# Patient Record
Sex: Male | Born: 2000 | Race: Black or African American | Hispanic: No | Marital: Single | Smoking: Never smoker
Health system: Southern US, Community
[De-identification: ages and names within clinical notes are randomized; demographics above are authoritative.]

## PROBLEM LIST (undated history)

## (undated) DIAGNOSIS — J45909 Unspecified asthma, uncomplicated: Secondary | ICD-10-CM

## (undated) DIAGNOSIS — M419 Scoliosis, unspecified: Secondary | ICD-10-CM

---
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
## 2020-08-07 ENCOUNTER — Encounter (HOSPITAL_BASED_OUTPATIENT_CLINIC_OR_DEPARTMENT_OTHER): Payer: Self-pay | Admitting: *Deleted

## 2020-08-07 ENCOUNTER — Other Ambulatory Visit: Payer: Self-pay

## 2020-08-07 DIAGNOSIS — Z7952 Long term (current) use of systemic steroids: Secondary | ICD-10-CM | POA: Insufficient documentation

## 2020-08-07 DIAGNOSIS — W208XXA Other cause of strike by thrown, projected or falling object, initial encounter: Secondary | ICD-10-CM | POA: Diagnosis not present

## 2020-08-07 DIAGNOSIS — J45909 Unspecified asthma, uncomplicated: Secondary | ICD-10-CM | POA: Diagnosis not present

## 2020-08-07 DIAGNOSIS — S0990XA Unspecified injury of head, initial encounter: Secondary | ICD-10-CM | POA: Diagnosis present

## 2020-08-07 NOTE — ED Triage Notes (Signed)
C/o head injury x 3 days ago , denies LOC , c/o cont h/a

## 2020-08-08 ENCOUNTER — Emergency Department (HOSPITAL_BASED_OUTPATIENT_CLINIC_OR_DEPARTMENT_OTHER)
Admission: EM | Admit: 2020-08-08 | Discharge: 2020-08-08 | Disposition: A | Discharge status: Home / Self Care | Payer: Medicaid Other | Attending: Emergency Medicine | Admitting: Emergency Medicine

## 2020-08-08 ENCOUNTER — Emergency Department (HOSPITAL_BASED_OUTPATIENT_CLINIC_OR_DEPARTMENT_OTHER): Payer: Medicaid Other

## 2020-08-08 DIAGNOSIS — S0990XA Unspecified injury of head, initial encounter: Secondary | ICD-10-CM

## 2020-08-08 MED ORDER — NAPROXEN 250 MG PO TABS
500.0000 mg | ORAL_TABLET | Freq: Once | ORAL | Status: AC
  Administered 2020-08-08: 500 mg via ORAL
  Filled 2020-08-08: qty 2

## 2020-08-08 MED ORDER — NAPROXEN 500 MG PO TABS: ORAL_TABLET | ORAL | 0 refills | Status: AC

## 2020-08-08 NOTE — ED Provider Notes (Signed)
[facility] Provider Note: Lowella Dell, MD, FACEP  CSN: [id number] MRN: [id number] ARRIVAL: 08/07/20 at 2301 ROOM: [facility]   CHIEF COMPLAINT  Head Injury   HISTORY OF PRESENT ILLNESS  08/08/20 1:48 AM Bobby Mack is a 20 y.o. male who had a crockpot fall on the top of his head while he was trying to remove it from a kitchen cabinet 3 days ago.  He did not lose consciousness.  He has not had any vomiting.  He has had persistent headaches since this occurred.  The headaches have been in various locations in his head.  He currently rates his headache as a 4 out of 10.  He has had no focal neurologic deficits or visual changes.  He has not taken anything for his headache.  He denies neck pain.   Past Medical History:  Diagnosis Date  . Asthma   . Scoliosis     History reviewed. No pertinent surgical history.  Family History  Problem Relation Age of Onset  . Eczema Other   . Asthma Neg Hx   . Allergic rhinitis Neg Hx   . Urticaria Neg Hx   . Immunodeficiency Neg Hx   . Angioedema Neg Hx     Social History   Tobacco Use  . Smoking status: Never Smoker  . Smokeless tobacco: Never Used  Vaping Use  . Vaping Use: Never used  Substance Use Topics  . Alcohol use: No  . Drug use: No    Prior to Admission medications   Medication Sig Start Date End Date Taking? Authorizing Provider  EPINEPHrine (EPIPEN 2-PAK) 0.3 mg/0.3 mL IJ SOAJ injection Use as directed for severe allergic reactions 10/14/17   Bobbitt, Heywood Iles, MD  fluticasone [facility]) 50 MCG/ACT nasal spray Use 1 spray per nostril 1-2 times daily as needed 10/14/17   Bobbitt, Heywood Iles, MD  levocetirizine (XYZAL) 5 MG tablet Take 1 tablet (5 mg total) by mouth every evening. 10/14/17   Bobbitt, Heywood Iles, MD    Allergies Shellfish allergy   REVIEW OF SYSTEMS  Negative except as noted here or in the History of Present Illness.   PHYSICAL EXAMINATION  Initial Vital Signs Blood  pressure 118/78, pulse 79, temperature 98.1 F (36.7 C), temperature source Oral, resp. rate 16, height 5\' 6"  (1.676 m), weight 81.6 kg, SpO2 100 %.  Examination General: Well-developed, well-nourished male in no acute distress; appearance consistent with age of record HENT: normocephalic; no scalp hematoma; no hemotympanum Eyes: pupils equal, round and reactive to light; extraocular muscles intact Neck: supple; nontender Heart: regular rate and rhythm Lungs: clear to auscultation bilaterally Abdomen: soft; nondistended; nontender; bowel sounds present Extremities: No deformity; full range of motion Neurologic: Awake, alert and oriented; motor function intact in all extremities and symmetric; no facial droop Skin: Warm and dry Psychiatric: Normal mood and affect   RESULTS  Summary of this visit's results, reviewed and interpreted by myself:   EKG Interpretation  Date/Time:    Ventricular Rate:    PR Interval:    QRS Duration:   QT Interval:    QTC Calculation:   R Axis:     Text Interpretation:        Laboratory Studies: No results found for this or any previous visit (from the past 24 hour(s)). Imaging Studies: CT Head Wo Contrast  Result Date: 08/08/2020 CLINICAL DATA:  Head injury 3 days ago. Continued headache. No loss of consciousness. EXAM: CT HEAD WITHOUT CONTRAST TECHNIQUE: Contiguous axial images  were obtained from the base of the skull through the vertex without intravenous contrast. COMPARISON:  None. FINDINGS: Brain: No evidence of acute infarction, hemorrhage, hydrocephalus, extra-axial collection or mass lesion/mass effect. Vascular: No hyperdense vessel or unexpected calcification. Skull: Normal. Negative for fracture or focal lesion. Sinuses/Orbits: Mucosal thickening in the paranasal sinuses. No acute air-fluid levels. Mastoid air cells are clear. Other: None. IMPRESSION: 1. No acute intracranial abnormalities. 2. Chronic inflammatory changes in the paranasal  sinuses. Electronically Signed   By: Burman Nieves M.D.   On: 08/08/2020 02:21    ED COURSE and MDM  Nursing notes, initial and subsequent vitals signs, including pulse oximetry, reviewed and interpreted by myself.  Vitals:   08/07/20 2310 08/07/20 2312  BP:  118/78  Pulse:  79  Resp:  16  Temp:  98.1 F (36.7 C)  TempSrc:  Oral  SpO2:  100%  Weight: 81.6 kg   Height: 5\' 6"  (1.676 m)    Medications  naproxen (NAPROSYN) tablet 500 mg (has no administration in time range)    No evidence of significant head injury on examination or CT.  PROCEDURES  Procedures   ED DIAGNOSES     ICD-10-CM   1. Minor head injury, initial encounter  S09.90XA        , MD 08/08/20 [phone]

## 2021-12-30 IMAGING — CT CT HEAD W/O CM
3 of 6 series · 16 of 47 positions shown, 19 images · non-contrast
Comparison: None.

CLINICAL DATA: Head injury 3 days ago. Continued headache. No loss
of consciousness.

EXAM:
CT HEAD WITHOUT CONTRAST
TECHNIQUE: Contiguous axial images were obtained from the base of the skull
through the vertex without intravenous contrast.

[Series 2: head wo · axial · 0.42mm/px · z∈[+868,+988]mm · 11 of 29 slices shown, 14 images]
[im 3/29  brain]
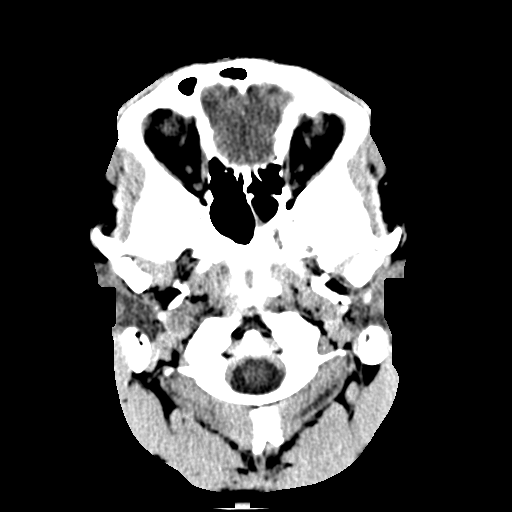
[im 3/29  bone]
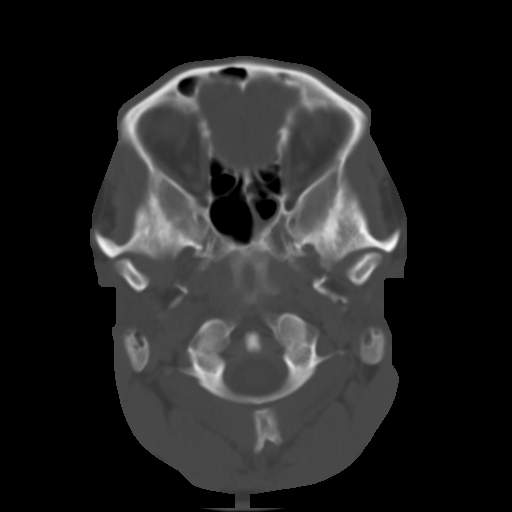
[im 5/29  brain]
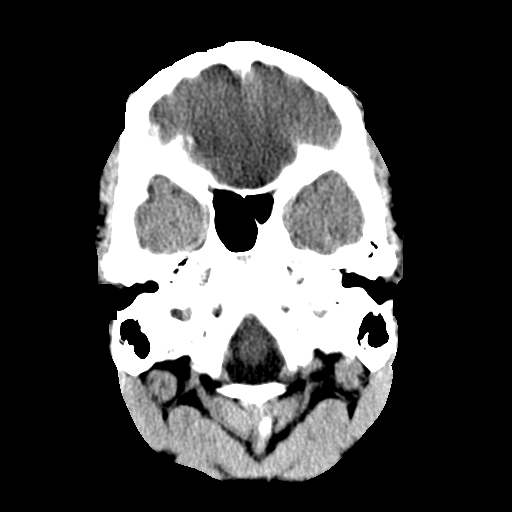
[im 7/29  brain]
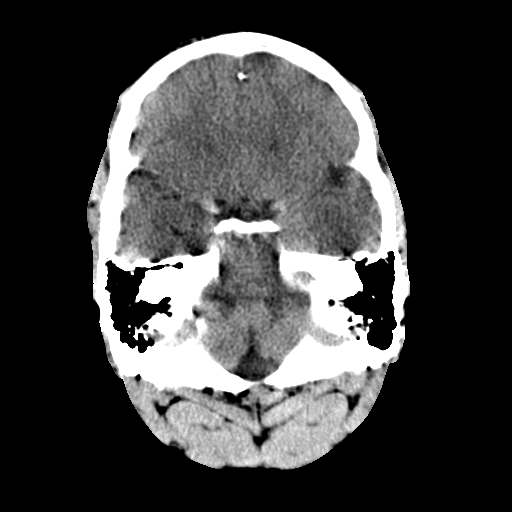
[im 11/29  brain]
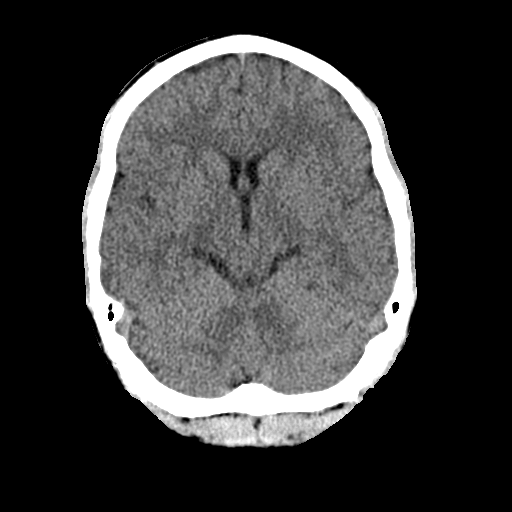
[im 13/29  brain]
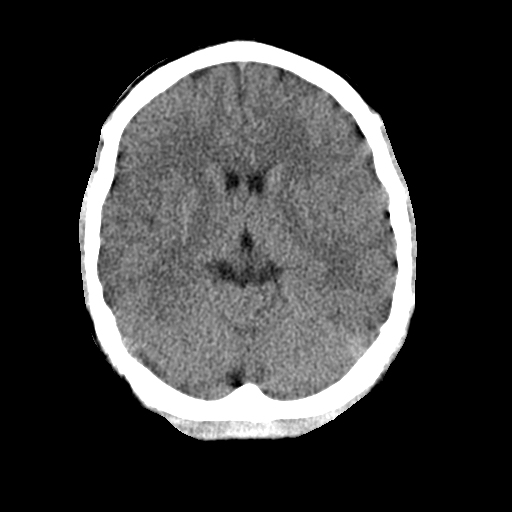
[im 13/29  bone]
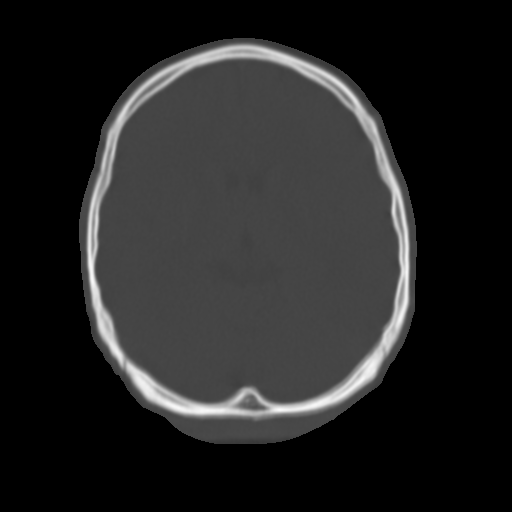
[im 15/29  brain]
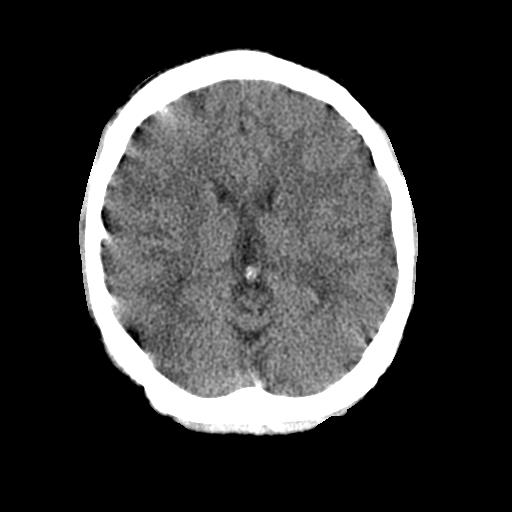
[im 17/29  brain]
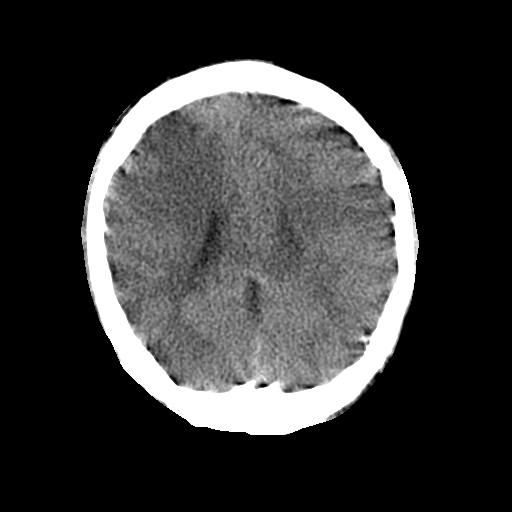
[im 19/29  brain]
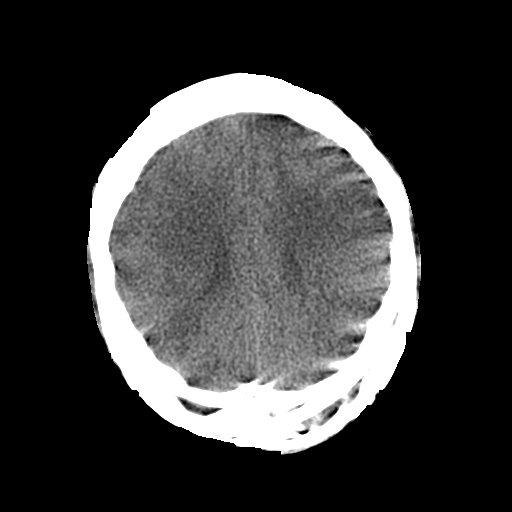
[im 23/29  brain]
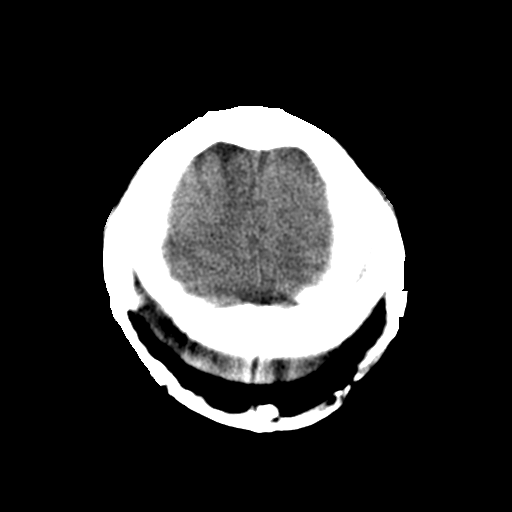
[im 23/29  bone]
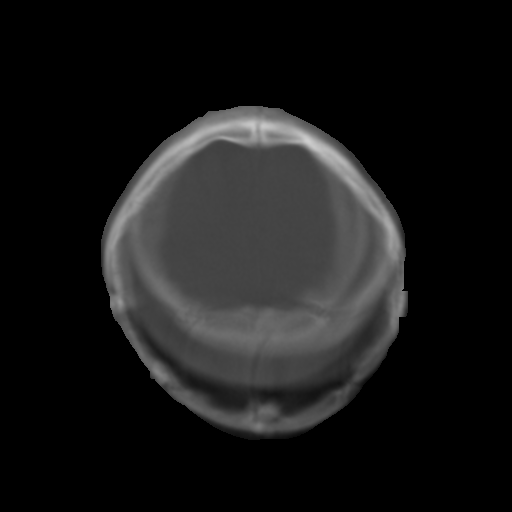
[im 25/29  brain]
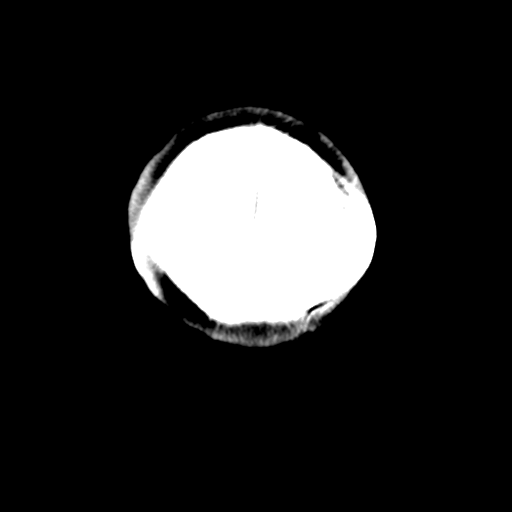
[im 27/29  brain]
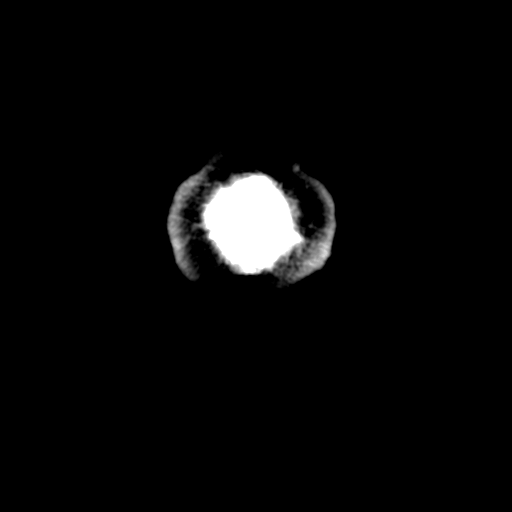

[Series 4: cor soft · coronal · 0.31mm/px · 3 of 63 slices shown]
[im 15/63  brain]
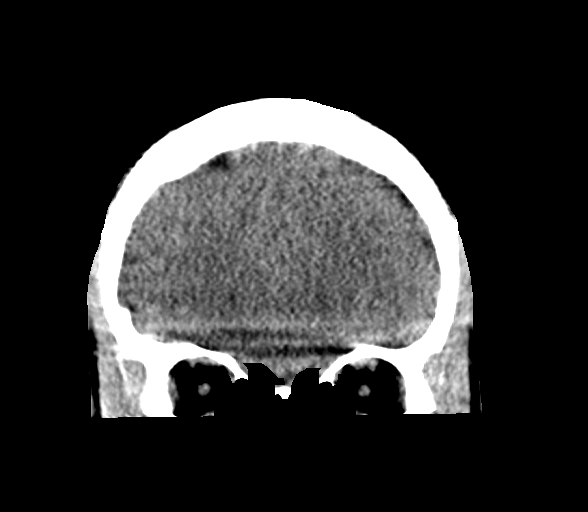
[im 29/63  brain]
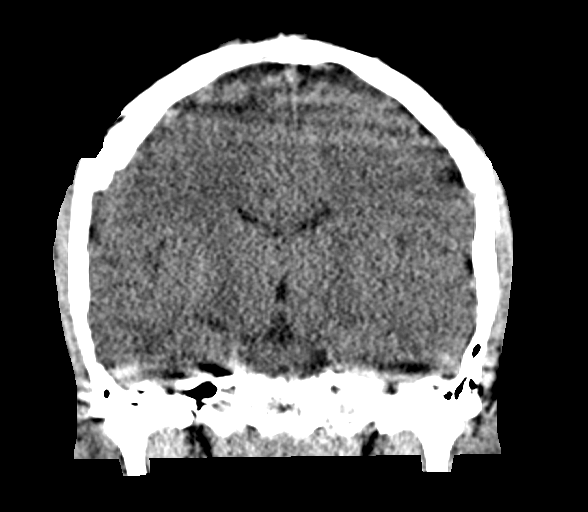
[im 43/63  brain]
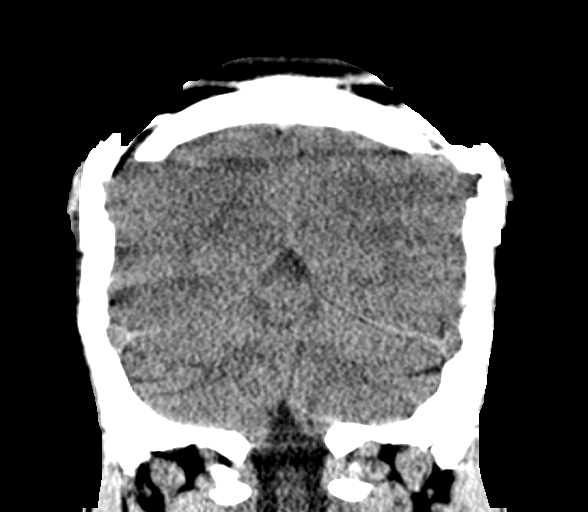

[Series 9: sag soft · sagittal · 0.31mm/px · 2 of 51 slices shown]
[im 17/51  brain]
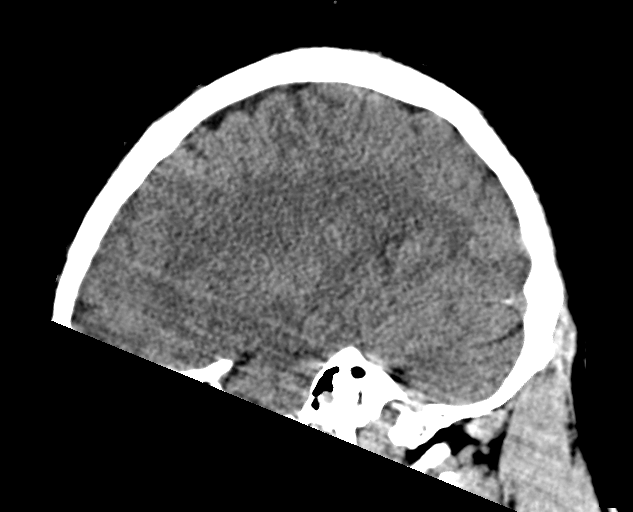
[im 34/51  brain]
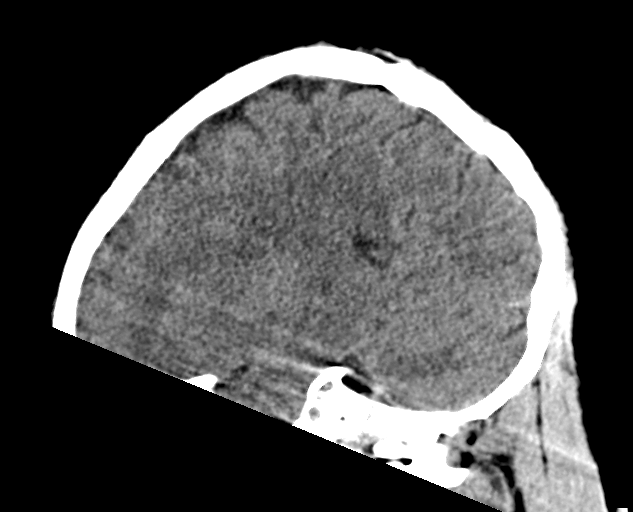

[16 of 47 positions shown; findings below may reference images not displayed]

FINDINGS: Brain: No evidence of acute infarction, hemorrhage, hydrocephalus,
extra-axial collection or mass lesion/mass effect.

Vascular: No hyperdense vessel or unexpected calcification.

Skull: Normal. Negative for fracture or focal lesion.

Sinuses/Orbits: Mucosal thickening in the paranasal sinuses. No
acute air-fluid levels. Mastoid air cells are clear.

Other: None.
IMPRESSION: 1. No acute intracranial abnormalities.
2. Chronic inflammatory changes in the paranasal sinuses.
